# Patient Record
Sex: Male | Born: 1995 | Race: Black or African American | Hispanic: No | Marital: Single | Smoking: Current every day smoker
Health system: Southern US, Community
[De-identification: ages and names within clinical notes are randomized; demographics above are authoritative.]

---
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
## 2015-01-08 ENCOUNTER — Emergency Department (HOSPITAL_COMMUNITY)
Admission: EM | Admit: 2015-01-08 | Discharge: 2015-01-08 | Disposition: A | Discharge status: Home / Self Care | Payer: BLUE CROSS/BLUE SHIELD | Attending: Emergency Medicine | Admitting: Emergency Medicine

## 2015-01-08 ENCOUNTER — Emergency Department (HOSPITAL_COMMUNITY): Payer: BLUE CROSS/BLUE SHIELD

## 2015-01-08 ENCOUNTER — Encounter (HOSPITAL_COMMUNITY): Payer: Self-pay | Admitting: Emergency Medicine

## 2015-01-08 DIAGNOSIS — Z720 Tobacco use: Secondary | ICD-10-CM | POA: Insufficient documentation

## 2015-01-08 DIAGNOSIS — M549 Dorsalgia, unspecified: Secondary | ICD-10-CM | POA: Insufficient documentation

## 2015-01-08 DIAGNOSIS — R51 Headache: Secondary | ICD-10-CM | POA: Insufficient documentation

## 2015-01-08 DIAGNOSIS — J4521 Mild intermittent asthma with (acute) exacerbation: Secondary | ICD-10-CM | POA: Insufficient documentation

## 2015-01-08 DIAGNOSIS — R0789 Other chest pain: Secondary | ICD-10-CM | POA: Insufficient documentation

## 2015-01-08 DIAGNOSIS — J4520 Mild intermittent asthma, uncomplicated: Secondary | ICD-10-CM

## 2015-01-08 DIAGNOSIS — J40 Bronchitis, not specified as acute or chronic: Secondary | ICD-10-CM

## 2015-01-08 MED ORDER — PREDNISONE 20 MG PO TABS: 40.0000 mg | ORAL_TABLET | Freq: Every day | ORAL | Status: DC

## 2015-01-08 MED ORDER — GUAIFENESIN-CODEINE 100-10 MG/5ML PO SOLN: 5.0000 mL | Freq: Four times a day (QID) | ORAL | Status: DC | PRN

## 2015-01-08 MED ORDER — GUAIFENESIN-CODEINE 100-10 MG/5ML PO SOLN
5.0000 mL | Freq: Once | ORAL | Status: AC
  Administered 2015-01-08: 5 mL via ORAL
  Filled 2015-01-08: qty 5

## 2015-01-08 MED ORDER — PREDNISONE 20 MG PO TABS
60.0000 mg | ORAL_TABLET | Freq: Once | ORAL | Status: AC
  Administered 2015-01-08: 60 mg via ORAL
  Filled 2015-01-08: qty 3

## 2015-01-08 MED ORDER — AMOXICILLIN 500 MG PO CAPS: 500.0000 mg | ORAL_CAPSULE | Freq: Three times a day (TID) | ORAL | Status: DC

## 2015-01-08 MED ORDER — AEROCHAMBER PLUS FLO-VU LARGE MISC
1.0000 | Freq: Once | Status: AC
  Administered 2015-01-08: 1
  Filled 2015-01-08: qty 1

## 2015-01-08 MED ORDER — ALBUTEROL SULFATE HFA 108 (90 BASE) MCG/ACT IN AERS
1.0000 | INHALATION_SPRAY | Freq: Once | RESPIRATORY_TRACT | Status: AC
  Administered 2015-01-08: 2 via RESPIRATORY_TRACT
  Filled 2015-01-08: qty 6.7

## 2015-01-08 NOTE — Discharge Instructions (Signed)
Bronchospasm °A bronchospasm is a spasm or tightening of the airways going into the lungs. During a bronchospasm breathing becomes more difficult because the airways get smaller. When this happens there can be coughing, a whistling sound when breathing (wheezing), and difficulty breathing. Bronchospasm is often associated with asthma, but not all patients who experience a bronchospasm have asthma. °CAUSES  °A bronchospasm is caused by inflammation or irritation of the airways. The inflammation or irritation may be triggered by:  °· Allergies (such as to animals, pollen, food, or mold). Allergens that cause bronchospasm may cause wheezing immediately after exposure or many hours later.   °· Infection. Viral infections are believed to be the most common cause of bronchospasm.   °· Exercise.   °· Irritants (such as pollution, cigarette smoke, strong odors, aerosol sprays, and paint fumes).   °· Weather changes. Winds increase molds and pollens in the air. Rain refreshes the air by washing irritants out. Cold air may cause inflammation.   °· Stress and emotional upset.   °SIGNS AND SYMPTOMS  °· Wheezing.   °· Excessive nighttime coughing.   °· Frequent or severe coughing with a simple cold.   °· Chest tightness.   °· Shortness of breath.   °DIAGNOSIS  °Bronchospasm is usually diagnosed through a history and physical exam. Tests, such as chest X-rays, are sometimes done to look for other conditions. °TREATMENT  °· Inhaled medicines can be given to open up your airways and help you breathe. The medicines can be given using either an inhaler or a nebulizer machine. °· Corticosteroid medicines may be given for severe bronchospasm, usually when it is associated with asthma. °HOME CARE INSTRUCTIONS  °· Always have a plan prepared for seeking medical care. Know when to call your health care provider and local emergency services (911 in the [place] Know where you can access local emergency care. °· Only take medicines as  directed by your health care provider. °· If you were prescribed an inhaler or nebulizer machine, ask your health care provider to explain how to use it correctly. Always use a spacer with your inhaler if you were given one. °· It is necessary to remain calm during an attack. Try to relax and breathe more slowly.  °· Control your home environment in the following ways:   °· Change your heating and air conditioning filter at least once a month.   °· Limit your use of fireplaces and wood stoves. °· Do not smoke and do not allow smoking in your home.   °· Avoid exposure to perfumes and fragrances.   °· Get rid of pests (such as roaches and mice) and their droppings.   °· Throw away plants if you see mold on them.   °· Keep your house clean and dust free.   °· Replace carpet with wood, tile, or vinyl flooring. Carpet can trap dander and dust.   °· Use allergy-proof pillows, mattress covers, and box spring covers.   °· Wash bed sheets and blankets every week in hot water and dry them in a dryer.   °· Use blankets that are made of polyester or cotton.   °· Wash hands frequently. °SEEK MEDICAL CARE IF:  °· You have muscle aches.   °· You have chest pain.   °· The sputum changes from clear or white to yellow, green, gray, or bloody.   °· The sputum you cough up gets thicker.   °· There are problems that may be related to the medicine you are given, such as a rash, itching, swelling, or trouble breathing.   °SEEK IMMEDIATE MEDICAL CARE IF:  °· You have worsening wheezing and coughing even   after taking your prescribed medicines.   °· You have increased difficulty breathing.   °· You develop severe chest pain. °MAKE SURE YOU:  °· Understand these instructions. °· Will watch your condition. °· Will get help right away if you are not doing well or get worse. °Document Released: 08/19/2003 Document Revised: 08/21/2013 Document Reviewed: 02/05/2013 °ExitCare® Patient Information ©2015 [employer]. This information is not  intended to replace advice given to you by your health care provider. Make sure you discuss any questions you have with your health care provider. ° °Upper Respiratory Infection, Adult °An upper respiratory infection (URI) is also sometimes known as the common cold. The upper respiratory tract includes the nose, sinuses, throat, trachea, and bronchi. Bronchi are the airways leading to the lungs. Most people improve within 1 week, but symptoms can last up to 2 weeks. A residual cough may last even longer.  °CAUSES °Many different viruses can infect the tissues lining the upper respiratory tract. The tissues become irritated and inflamed and often become very moist. Mucus production is also common. A cold is contagious. You can easily spread the virus to others by oral contact. This includes kissing, sharing a glass, coughing, or sneezing. Touching your mouth or nose and then touching a surface, which is then touched by another person, can also spread the virus. °SYMPTOMS  °Symptoms typically develop 1 to 3 days after you come in contact with a cold virus. Symptoms vary from person to person. They may include: °· Runny nose. °· Sneezing. °· Nasal congestion. °· Sinus irritation. °· Sore throat. °· Loss of voice (laryngitis). °· Cough. °· Fatigue. °· Muscle aches. °· Loss of appetite. °· Headache. °· Low-grade fever. °DIAGNOSIS  °You might diagnose your own cold based on familiar symptoms, since most people get a cold 2 to 3 times a year. Your caregiver can confirm this based on your exam. Most importantly, your caregiver can check that your symptoms are not due to another disease such as strep throat, sinusitis, pneumonia, asthma, or epiglottitis. Blood tests, throat tests, and X-rays are not necessary to diagnose a common cold, but they may sometimes be helpful in excluding other more serious diseases. Your caregiver will decide if any further tests are required. °RISKS AND COMPLICATIONS  °You may be at risk for a  more severe case of the common cold if you smoke cigarettes, have chronic heart disease (such as heart failure) or lung disease (such as asthma), or if you have a weakened immune system. The very young and very old are also at risk for more serious infections. Bacterial sinusitis, middle ear infections, and bacterial pneumonia can complicate the common cold. The common cold can worsen asthma and chronic obstructive pulmonary disease (COPD). Sometimes, these complications can require emergency medical care and may be life-threatening. °PREVENTION  °The best way to protect against getting a cold is to practice good hygiene. Avoid oral or hand contact with people with cold symptoms. Wash your hands often if contact occurs. There is no clear evidence that vitamin C, vitamin E, echinacea, or exercise reduces the chance of developing a cold. However, it is always recommended to get plenty of rest and practice good nutrition. °TREATMENT  °Treatment is directed at relieving symptoms. There is no cure. Antibiotics are not effective, because the infection is caused by a virus, not by bacteria. Treatment may include: °· Increased fluid intake. Sports drinks offer valuable electrolytes, sugars, and fluids. °· Breathing heated mist or steam (vaporizer or shower). °· Eating chicken soup   or other clear broths, and maintaining good nutrition. °· Getting plenty of rest. °· Using gargles or lozenges for comfort. °· Controlling fevers with ibuprofen or acetaminophen as directed by your caregiver. °· Increasing usage of your inhaler if you have asthma. °Zinc gel and zinc lozenges, taken in the first 24 hours of the common cold, can shorten the duration and lessen the severity of symptoms. Pain medicines may help with fever, muscle aches, and throat pain. A variety of non-prescription medicines are available to treat congestion and runny nose. Your caregiver can make recommendations and may suggest nasal or lung inhalers for other  symptoms.  °HOME CARE INSTRUCTIONS  °· Only take over-the-counter or prescription medicines for pain, discomfort, or fever as directed by your caregiver. °· Use a warm mist humidifier or inhale steam from a shower to increase air moisture. This may keep secretions moist and make it easier to breathe. °· Drink enough water and fluids to keep your urine clear or pale yellow. °· Rest as needed. °· Return to work when your temperature has returned to normal or as your caregiver advises. You may need to stay home longer to avoid infecting others. You can also use a face mask and careful hand washing to prevent spread of the virus. °SEEK MEDICAL CARE IF:  °· After the first few days, you feel you are getting worse rather than better. °· You need your caregiver's advice about medicines to control symptoms. °· You develop chills, worsening shortness of breath, or brown or red sputum. These may be signs of pneumonia. °· You develop yellow or brown nasal discharge or pain in the face, especially when you bend forward. These may be signs of sinusitis. °· You develop a fever, swollen neck glands, pain with swallowing, or white areas in the back of your throat. These may be signs of strep throat. °SEEK IMMEDIATE MEDICAL CARE IF:  °· You have a fever. °· You develop severe or persistent headache, ear pain, sinus pain, or chest pain. °· You develop wheezing, a prolonged cough, cough up blood, or have a change in your usual mucus (if you have chronic lung disease). °· You develop sore muscles or a stiff neck. °Document Released: 02/09/2001 Document Revised: 11/08/2011 Document Reviewed: 11/21/2013 °ExitCare® Patient Information ©2015 [employer]. This information is not intended to replace advice given to you by your health care provider. Make sure you discuss any questions you have with your health care provider. ° °

## 2015-01-08 NOTE — ED Notes (Addendum)
Pt is requesting that his ears be assessed.  This RN spoke to pt about his symptoms, denies any pain, but sts "they feel congested"  Pt also requesting medication to help with his nasal congestion.

## 2015-01-08 NOTE — ED Provider Notes (Signed)
CSN: [id number]     Arrival date & time 01/08/15  1955 History  This chart was scribed for a non-physician practitioner, Arthor CaptainAbigail Tzion Wedel, PA-C working with Raeford RazorStephen Kohut, MD by [place] Peace, ED Scribe. The patient was seen in TR04C/TR04C. The patient's care was started at 10:01 PM.    Chief Complaint  Patient presents with  . Bronchitis    The patient said he was diagnosed with Bronch[facility] at [facility] and was given clear pills for his cough and bronchitis medicine.  The patient said he has gotten worse instead of better.  . Cough      Patient is a 19 y.o. male presenting with cough. The history is provided by the patient. No language interpreter was used.  Cough Associated symptoms: ear pain, headaches and shortness of breath   Associated symptoms: no fever   HPI Comments: Andrew Moyer is a 19 y.o. male who presents to the Emergency Department complaining of bronchitis-like symptoms including productive cough (sputum: yellowish), SOB, chest tightness, headache. Pt also complains of low back pain which he thinks is from coughing so much. No complaints of fever, nausea, or vomiting. He was seen at [facility] a few weeks ago where he was diagnosed with Bronchitis and given some antibiotics without relief. Pt notes he didn't get much sleep last night.  No history of asthma. Pt is current some day smoker but explains he hasn't smoked since symptoms started.    History reviewed. No pertinent past medical history. History reviewed. No pertinent past surgical history. Family History  Problem Relation Age of Onset  . Diabetes     History  Substance Use Topics  . Smoking status: Current Some Day Smoker  . Smokeless tobacco: Not on file  . Alcohol Use: Yes     Comment: occasional    Review of Systems  Constitutional: Negative for fever.  HENT: Positive for ear pain.   Respiratory: Positive for cough, chest tightness and shortness of breath.   Gastrointestinal: Negative for nausea  and vomiting.  Musculoskeletal: Positive for back pain.  Neurological: Positive for headaches.  All other systems reviewed and are negative.     Allergies  Review of patient's allergies indicates no known allergies.  Home Medications   Prior to Admission medications   Medication Sig Start Date End Date Taking? Authorizing Provider  azithromycin (ZITHROMAX) 250 MG tablet Take 1 tablet (250 mg total) by mouth daily. Take first 2 tablets together, then 1 every day until finished. 12/25/14   Hope Orlene OchM Neese, NP  benzonatate (TESSALON) 100 MG capsule Take 1 capsule (100 mg total) by mouth every 8 (eight) hours. 12/25/14   Hope Orlene OchM Neese, NP  HYDROcodone-acetaminophen (NORCO/VICODIN) 5-325 MG per tablet Take 1 tablet by mouth every 6 (six) hours as needed for pain. 05/30/12   Vickki HearingStanley E Harrison, MD  ibuprofen (ADVIL,MOTRIN) 800 MG tablet Take 1 tablet (800 mg total) by mouth every 8 (eight) hours as needed for pain. 05/30/12   Vickki HearingStanley E Harrison, MD   BP 112/57 mmHg  Pulse 84  Temp(Src) 98.4 F (36.9 C) (Oral)  Resp 24  Wt 181 lb (82.101 kg)  SpO2 97% Physical Exam  Constitutional: He is oriented to person, place, and time. He appears well-developed and well-nourished. No distress.  HENT:  Head: Normocephalic and atraumatic.  Right Ear: Tympanic membrane is injected.  Left Ear: Tympanic membrane normal.  Mouth/Throat: Oropharynx is clear and moist.  Eyes: Conjunctivae and EOM are normal. No scleral icterus.  Neck:  Normal range of motion. Neck supple. No tracheal deviation present.  Cardiovascular: Normal rate, regular rhythm and normal heart sounds.   Pulmonary/Chest: Effort normal and breath sounds normal. No respiratory distress.  Tight bronchitic cough. Lungs otherwise clear.   Abdominal: Soft. There is no tenderness.  Musculoskeletal: Normal range of motion. He exhibits no edema.  Neurological: He is alert and oriented to person, place, and time.  Skin: Skin is warm and dry. He is not  diaphoretic.  Psychiatric: He has a normal mood and affect. His behavior is normal.  Nursing note and vitals reviewed.   ED Course  Procedures (including critical care time) Labs Review Labs Reviewed - No data to display  Imaging Review Dg Chest 2 View  01/08/2015   CLINICAL DATA:  Cough and fever.  Bronchitis.  EXAM: CHEST  2 VIEW  COMPARISON:  December 24, 2014.  FINDINGS: The heart size and mediastinal contours are within normal limits. Both lungs are clear. No pneumothorax or pleural effusion is noted. The visualized skeletal structures are unremarkable.  IMPRESSION: No active cardiopulmonary disease.   Electronically Signed   By: Lupita RaiderJames  Green Jr, M.D.   On: 01/08/2015 21:23     EKG Interpretation None     Medications  guaiFENesin-codeine 100-10 MG/5ML solution 5 mL (not administered)   10:06 PM- Treatment plan was discussed with patient who verbalizes understanding and agrees.   MDM   Final diagnoses:  RAD (reactive airway disease) with wheezing, mild intermittent, uncomplicated  Bronchitis    Patient with bronchitis, reactive airway. She'll be discharged with prednisone, codeine and guaifenesin cough syrup. Right ear with acute otitis. Patient will be given amoxicillin. He appears safe for discharge. Discussed return precautions, follow up with PCP.  I personally performed the services described in this documentation, which was scribed in my presence. The recorded information has been reviewed and is accurate.      Arthor Captainbigail Cheria Sadiq, PA-C 01/10/15 1642  Raeford RazorStephen Kohut, MD 01/10/15 [id number]

## 2015-01-08 NOTE — ED Notes (Signed)
The patient said he was diagnosed with Bronchitis at [facility] and was given clear pills for his cough and bronchitis medicine.  The patient said he has gotten worse instead of better.  The patient denies fever, N/V.  The patient denies any other symptoms other than yellow sputum.

## 2015-05-20 IMAGING — DX DG CHEST 2V
2 series · 2 of 2 positions shown · non-contrast
Comparison: CT of the chest performed 06/06/2012

CLINICAL DATA: Acute onset of cough for 1 [DATE] weeks. Fever. Initial
encounter.

EXAM:
CHEST  2 VIEW

[chest pa]
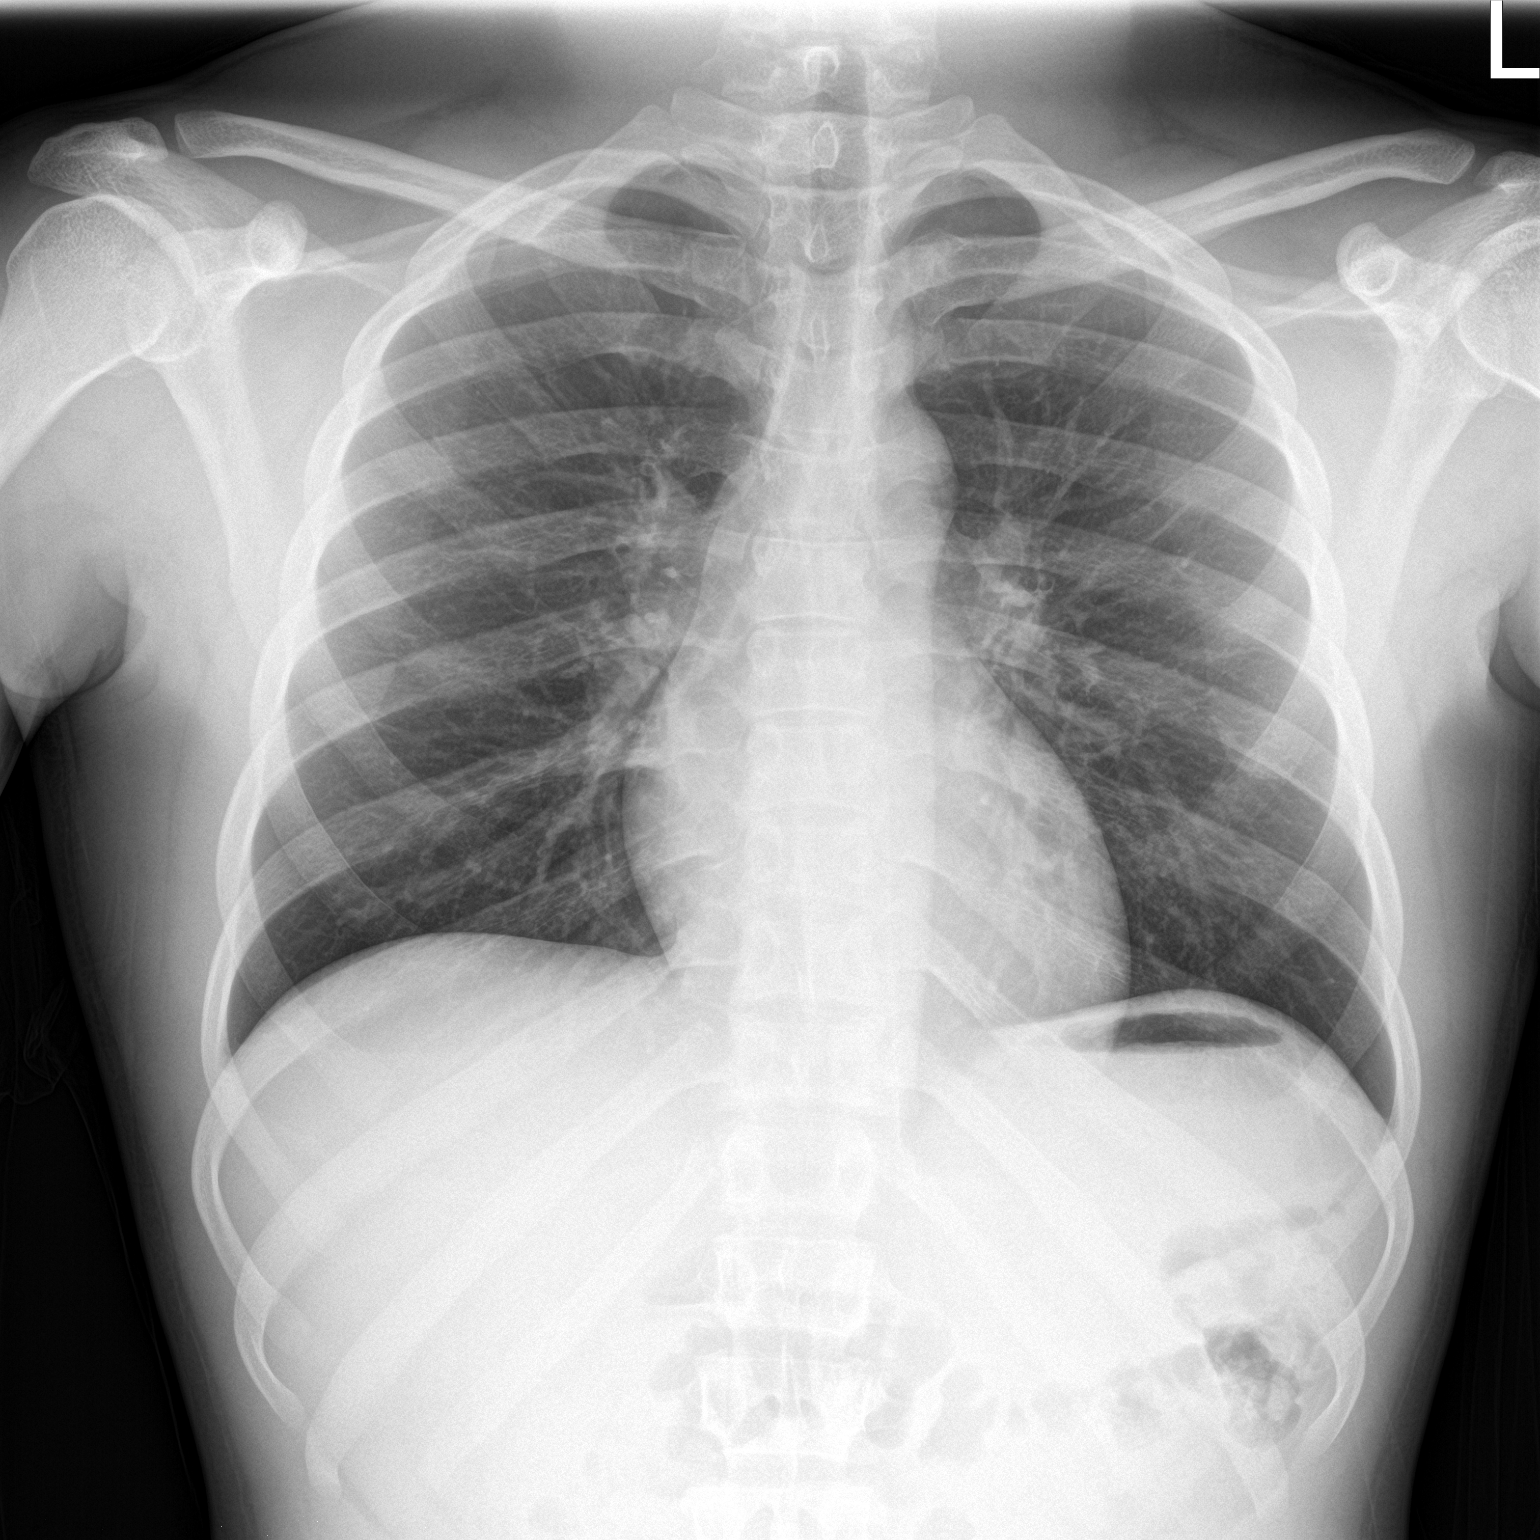

[chest lat]
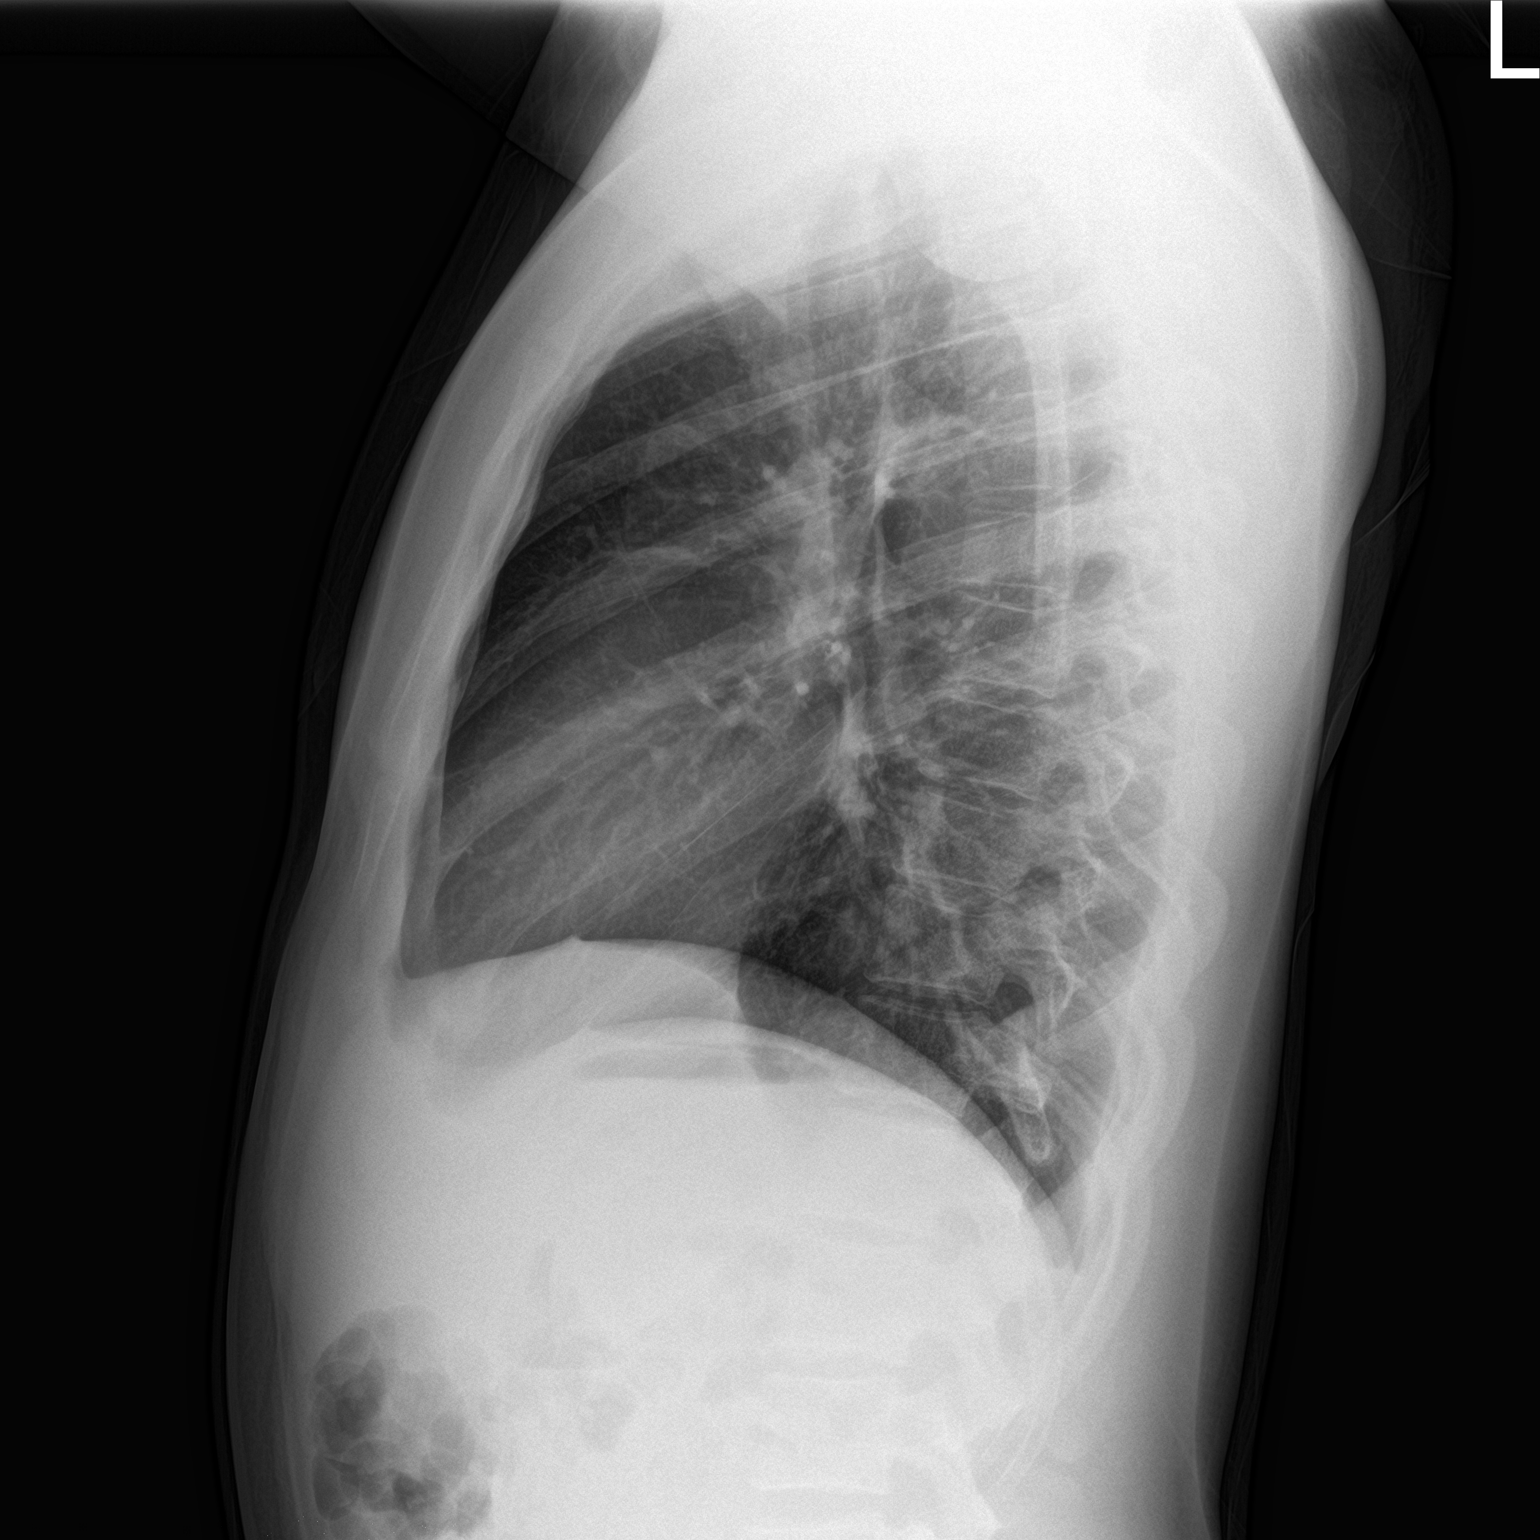

[2 of 2 positions shown; findings below may reference images not displayed]

FINDINGS: The lungs are well-aerated and clear. There is no evidence of focal
opacification, pleural effusion or pneumothorax.

The heart is normal in size; the mediastinal contour is within
normal limits. No acute osseous abnormalities are seen.
IMPRESSION: No acute cardiopulmonary process seen.

## 2015-06-04 IMAGING — CR DG CHEST 2V
2 series · 2 of 2 positions shown · non-contrast
Comparison: December 24, 2014.

CLINICAL DATA: Cough and fever.  Bronchitis.

EXAM:
CHEST  2 VIEW

[chest pa]
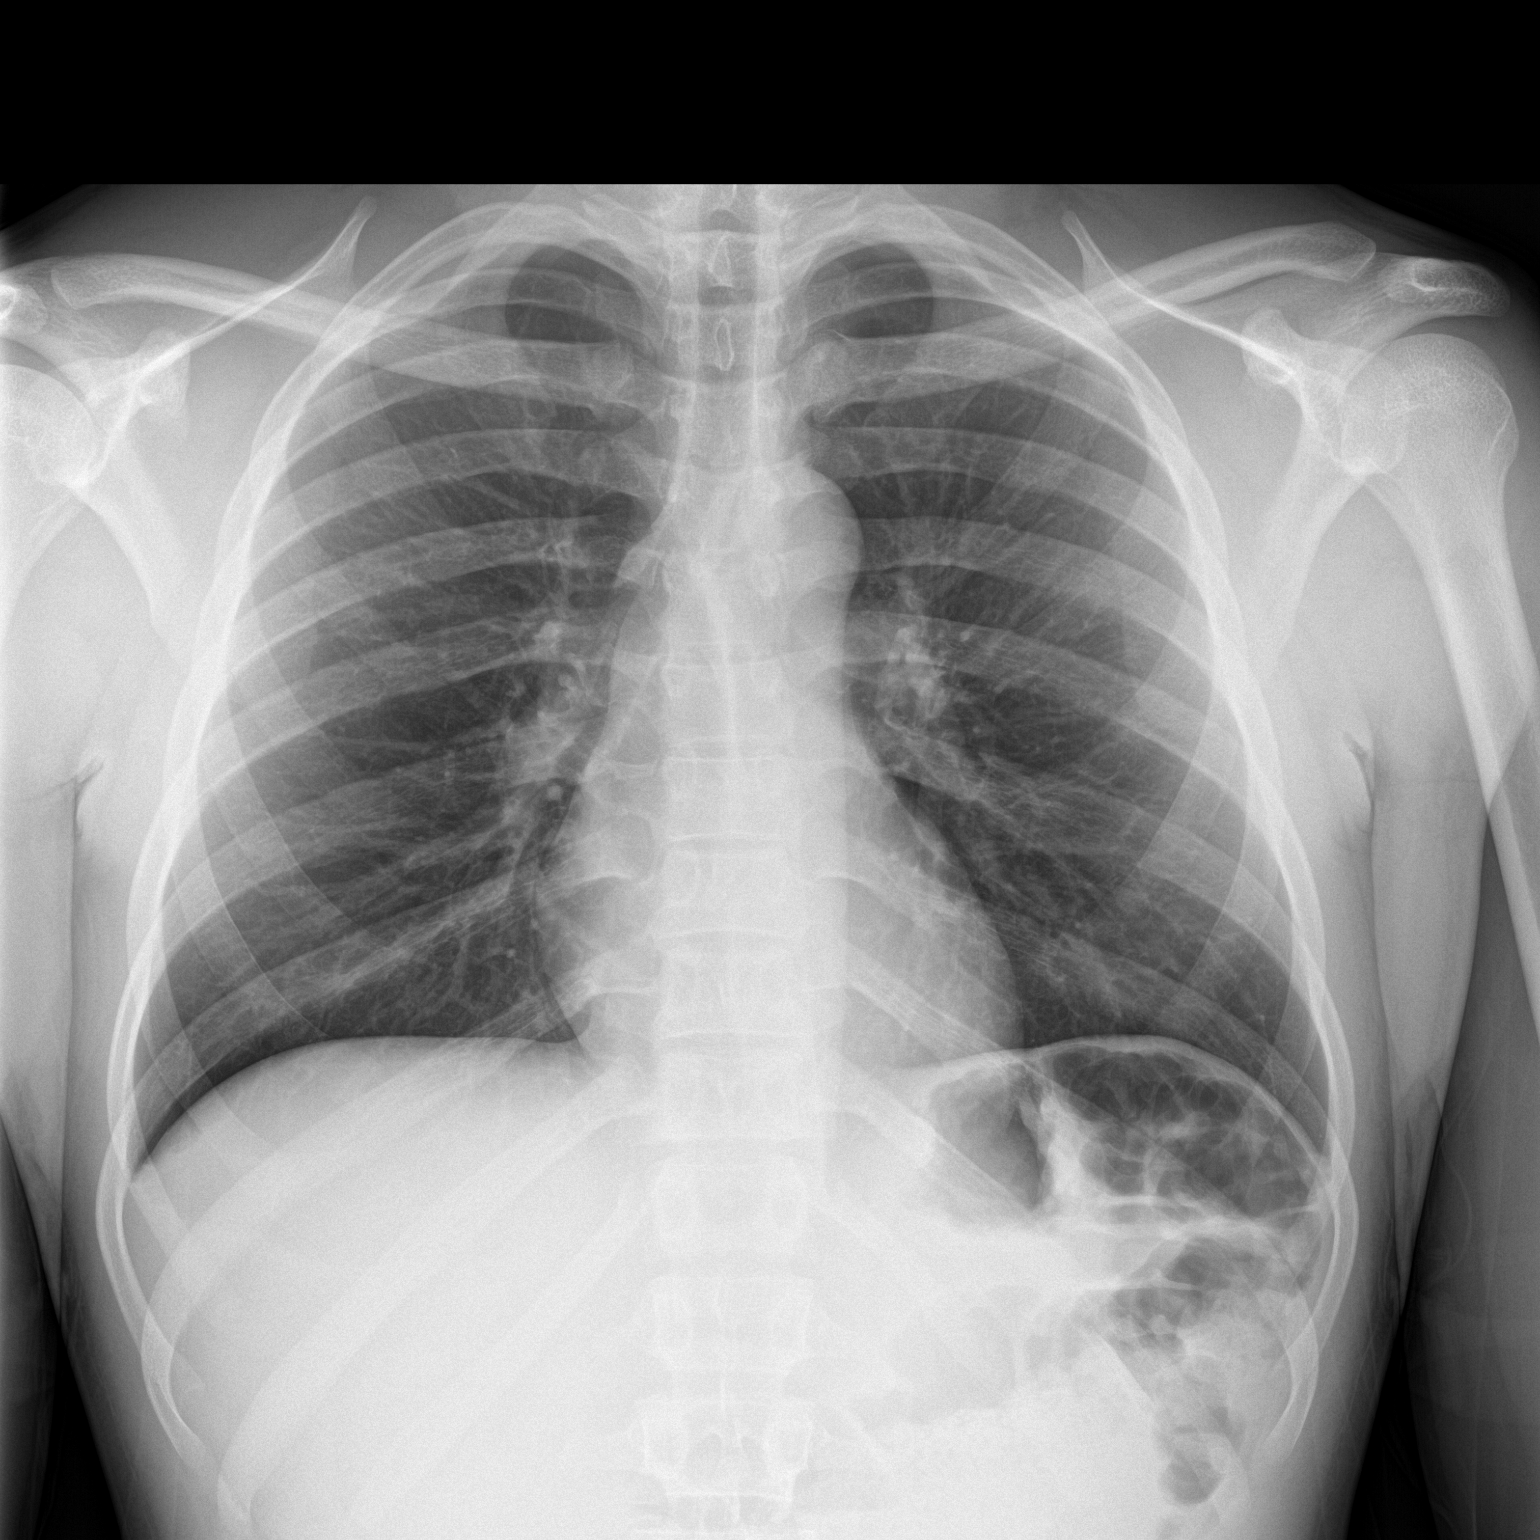

[chest lat]
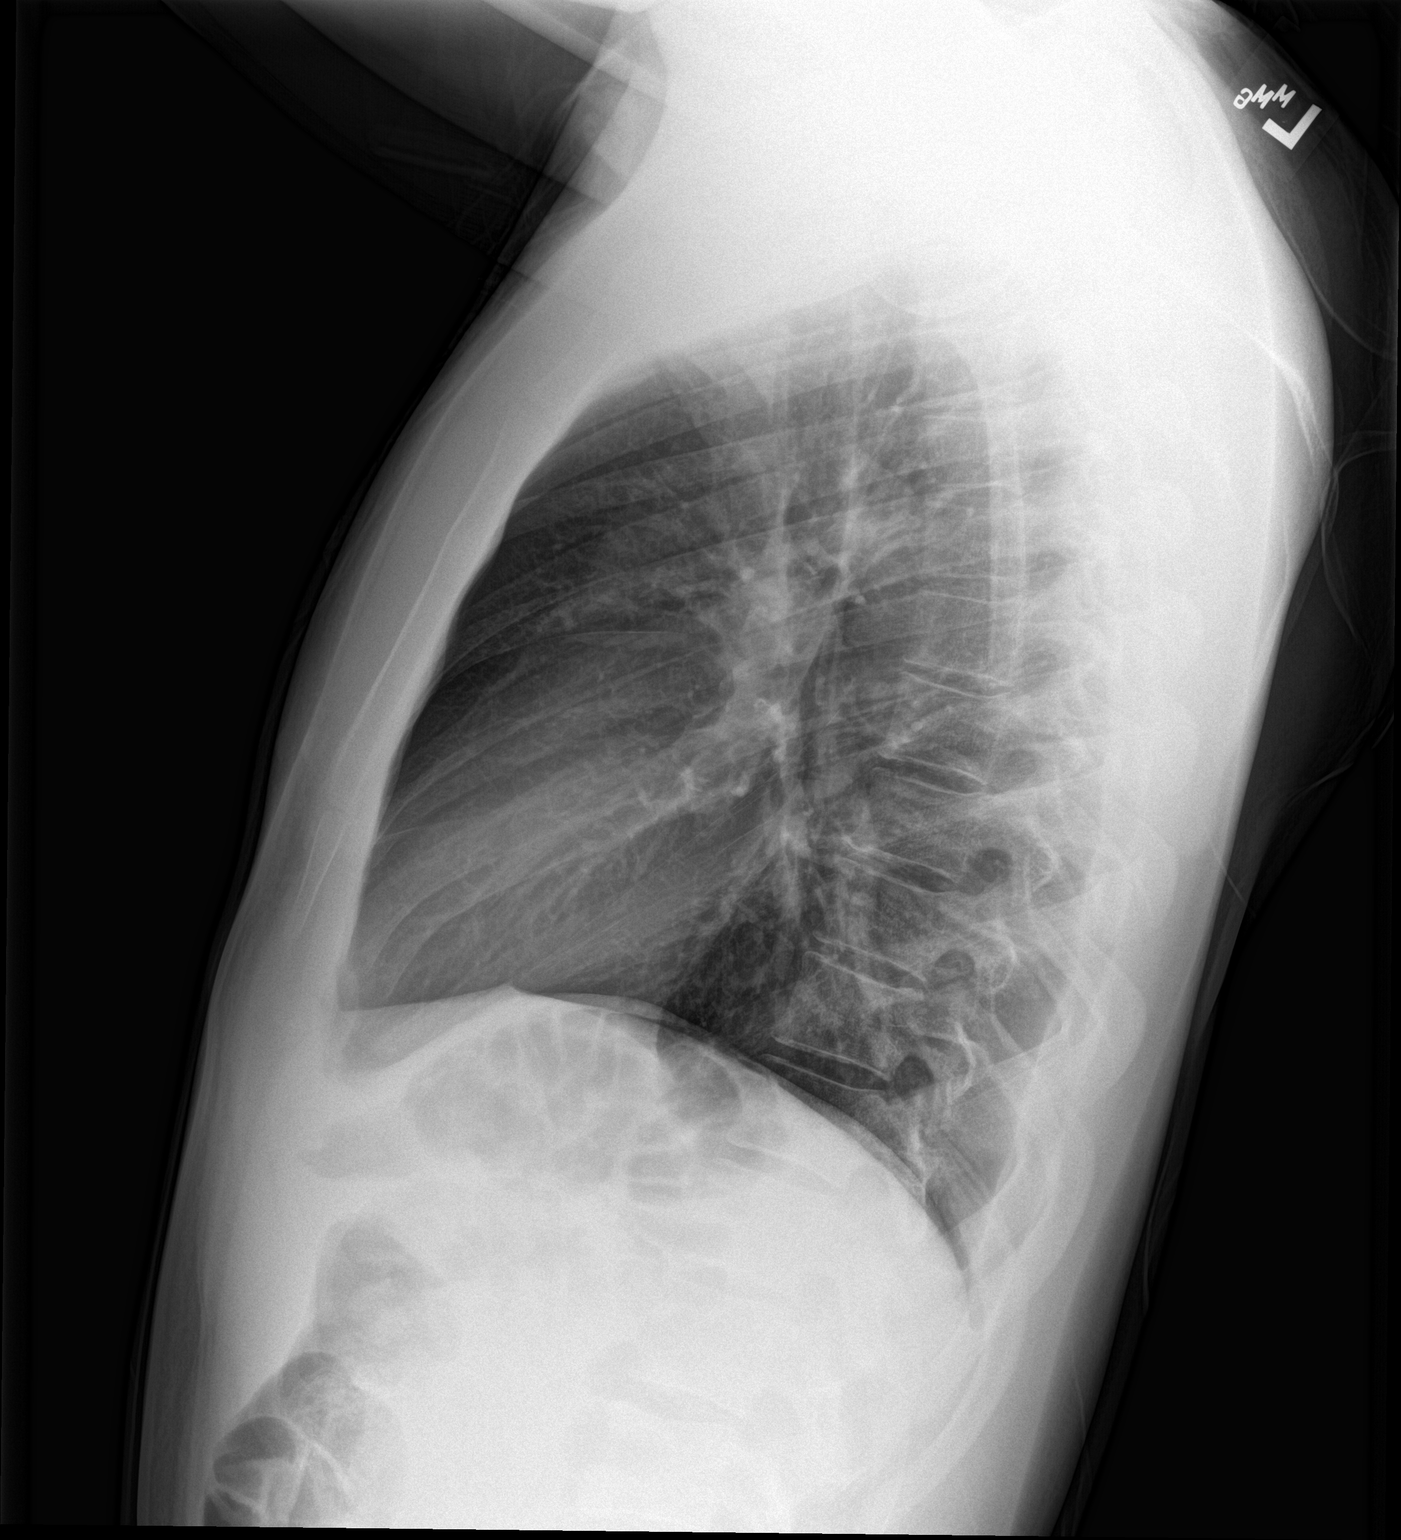

[2 of 2 positions shown; findings below may reference images not displayed]

FINDINGS: The heart size and mediastinal contours are within normal limits.
Both lungs are clear. No pneumothorax or pleural effusion is noted.
The visualized skeletal structures are unremarkable.
IMPRESSION: No active cardiopulmonary disease.

## 2015-08-27 ENCOUNTER — Emergency Department (HOSPITAL_COMMUNITY)
Admission: EM | Admit: 2015-08-27 | Discharge: 2015-08-27 | Disposition: A | Discharge status: Home / Self Care | Payer: Self-pay | Attending: Emergency Medicine | Admitting: Emergency Medicine

## 2015-08-27 ENCOUNTER — Encounter (HOSPITAL_COMMUNITY): Payer: Self-pay | Admitting: Emergency Medicine

## 2015-08-27 DIAGNOSIS — F17200 Nicotine dependence, unspecified, uncomplicated: Secondary | ICD-10-CM | POA: Insufficient documentation

## 2015-08-27 DIAGNOSIS — J029 Acute pharyngitis, unspecified: Secondary | ICD-10-CM | POA: Insufficient documentation

## 2015-08-27 DIAGNOSIS — R0981 Nasal congestion: Secondary | ICD-10-CM | POA: Insufficient documentation

## 2015-08-27 LAB — RAPID STREP SCREEN (MED CTR MEBANE ONLY): Streptococcus, Group A Screen (Direct): NEGATIVE

## 2015-08-27 MED ORDER — MAGIC MOUTHWASH W/LIDOCAINE: 5.0000 mL | Freq: Three times a day (TID) | ORAL | Status: DC | PRN

## 2015-08-27 MED ORDER — IBUPROFEN 800 MG PO TABS: 800.0000 mg | ORAL_TABLET | Freq: Three times a day (TID) | ORAL | Status: DC

## 2015-08-27 NOTE — Discharge Instructions (Signed)

## 2015-08-27 NOTE — ED Notes (Signed)
Patient complaining of sore throat starting last night.  

## 2015-08-30 LAB — CULTURE, GROUP A STREP

## 2015-08-30 NOTE — ED Provider Notes (Signed)
CSN: [id number]     Arrival date & time 08/27/15  1321 History   First MD Initiated Contact with Patient 08/27/15 1343     Chief Complaint  Patient presents with  . Sore Throat     (Consider location/radiation/quality/duration/timing/severity/associated sxs/prior Treatment) HPI  Andrew Moyer is a 19 y.o. male who presents to the Emergency Department complaining of sore throat for one day.  He states he woke up with the sore throat.  He has not tried any medications for the symptoms.  He states pain is worse with swallowing.  He denies fever, chills, rash, abd pain, cough, headache.   History reviewed. No pertinent past medical history. History reviewed. No pertinent past surgical history. Family History  Problem Relation Age of Onset  . Diabetes     Social History  Substance Use Topics  . Smoking status: Current Some Day Smoker  . Smokeless tobacco: None  . Alcohol Use: Yes     Comment: occasional    Review of Systems  Constitutional: Negative for fever, chills, activity change and appetite change.  HENT: Positive for congestion and sore throat. Negative for ear pain, facial swelling, trouble swallowing and voice change.   Eyes: Negative for pain and visual disturbance.  Respiratory: Negative for cough and shortness of breath.   Gastrointestinal: Negative for nausea, vomiting and abdominal pain.  Musculoskeletal: Negative for arthralgias, neck pain and neck stiffness.  Skin: Negative for color change and rash.  Neurological: Negative for dizziness, facial asymmetry, speech difficulty, numbness and headaches.  Hematological: Negative for adenopathy.  All other systems reviewed and are negative.     Allergies  Review of patient's allergies indicates no known allergies.  Home Medications   Prior to Admission medications   Medication Sig Start Date End Date Taking? Authorizing Provider  amoxicillin (AMOXIL) 500 MG capsule Take 1 capsule (500 mg total) by mouth 3  (three) times daily. Patient not taking: Reported on 08/27/2015 01/08/15   Arthor Captain, PA-C  azithromycin (ZITHROMAX) 250 MG tablet Take 1 tablet (250 mg total) by mouth daily. Take first 2 tablets together, then 1 every day until finished. Patient not taking: Reported on 08/27/2015 12/25/14   Janne Napoleon, NP  benzonatate (TESSALON) 100 MG capsule Take 1 capsule (100 mg total) by mouth every 8 (eight) hours. Patient not taking: Reported on 08/27/2015 12/25/14   Janne Napoleon, NP  guaiFENesin-codeine 100-10 MG/5ML syrup Take 5-10 mLs by mouth every 6 (six) hours as needed for cough. Patient not taking: Reported on 08/27/2015 01/08/15   Arthor Captain, PA-C  HYDROcodone-acetaminophen (NORCO/VICODIN) 5-325 MG per tablet Take 1 tablet by mouth every 6 (six) hours as needed for pain. Patient not taking: Reported on 08/27/2015 05/30/12   Vickki Hearing, MD  ibuprofen (ADVIL,MOTRIN) 800 MG tablet Take 1 tablet (800 mg total) by mouth 3 (three) times daily. 08/27/15   Khaniya Tenaglia, PA-C  magic mouthwash w/lidocaine SOLN Take 5 mLs by mouth 3 (three) times daily as needed for mouth pain. Swish and spit, do not swallow 08/27/15   Jaylissa Felty, PA-C  predniSONE (DELTASONE) 20 MG tablet Take 2 tablets (40 mg total) by mouth daily. Patient not taking: Reported on 08/27/2015 01/08/15   Arthor Captain, PA-C   BP 106/57 mmHg  Pulse 91  Temp(Src) 99.6 F (37.6 C) (Oral)  Resp 16  Ht  (1.778 m)  Wt 81.647 kg  BMI 25.83 kg/m2  SpO2 100% Physical Exam  Constitutional: He is oriented to person, place, and  time. He appears well-developed and well-nourished. No distress.  HENT:  Head: Normocephalic and atraumatic.  Right Ear: Tympanic membrane and ear canal normal.  Left Ear: Tympanic membrane and ear canal normal.  Mouth/Throat: Uvula is midline and mucous membranes are normal. No trismus in the jaw. No uvula swelling. Posterior oropharyngeal edema and posterior oropharyngeal erythema present. No  tonsillar abscesses.  Neck: Normal range of motion. Neck supple.  Cardiovascular: Normal rate, regular rhythm and normal heart sounds.   Pulmonary/Chest: Effort normal and breath sounds normal. No respiratory distress.  Abdominal: There is no splenomegaly. There is no tenderness.  Musculoskeletal: Normal range of motion.  Lymphadenopathy:    He has no cervical adenopathy.  Neurological: He is alert and oriented to person, place, and time. He exhibits normal muscle tone. Coordination normal.  Skin: Skin is warm and dry.  Nursing note and vitals reviewed.   ED Course  Procedures (including critical care time) Labs Review Labs Reviewed  CULTURE, GROUP A STREP - Abnormal; Notable for the following:    Strep A Culture Comment (*)    All other components within normal limits  RAPID STREP SCREEN (NOT AT [facility])    Imaging Review No results found. I have personally reviewed and evaluated these images and lab results as part of my medical decision-making.   EKG Interpretation None      MDM   Final diagnoses:  Pharyngitis    Pt is well appearing.  Airway patent.  Non-toxic.  Strep neg.  Pt agrees to symptomatic tx with magic mouthwash and ibuprofen PMD f/u if needed    Pauline Ausammy Thorin Starner, PA-C 08/30/15 2303  Glynn OctaveStephen Rancour, MD 08/31/15 [phone]

## 2017-03-22 ENCOUNTER — Emergency Department (HOSPITAL_COMMUNITY)
Admission: EM | Admit: 2017-03-22 | Discharge: 2017-03-22 | Disposition: A | Discharge status: Home / Self Care | Payer: Self-pay | Attending: Emergency Medicine | Admitting: Emergency Medicine

## 2017-03-22 ENCOUNTER — Encounter (HOSPITAL_COMMUNITY): Payer: Self-pay | Admitting: Emergency Medicine

## 2017-03-22 DIAGNOSIS — B86 Scabies: Secondary | ICD-10-CM | POA: Insufficient documentation

## 2017-03-22 DIAGNOSIS — F17210 Nicotine dependence, cigarettes, uncomplicated: Secondary | ICD-10-CM | POA: Insufficient documentation

## 2017-03-22 DIAGNOSIS — Z79899 Other long term (current) drug therapy: Secondary | ICD-10-CM | POA: Insufficient documentation

## 2017-03-22 MED ORDER — PERMETHRIN 5 % EX CREA: TOPICAL_CREAM | CUTANEOUS | 1 refills | Status: DC

## 2017-03-22 MED ORDER — DIPHENHYDRAMINE HCL 25 MG PO TABS: 25.0000 mg | ORAL_TABLET | ORAL | 0 refills | Status: DC | PRN

## 2017-03-22 NOTE — ED Triage Notes (Signed)
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 Rash to wrist. He is concerned that he has scabies.

## 2017-03-22 NOTE — Discharge Instructions (Signed)
Be sure to wash all towels, clothing and ben linens in hot water after treatment.

## 2017-03-22 NOTE — ED Provider Notes (Signed)
[facility]-EMERGENCY DEPT Provider Note   CSN: [id number] Arrival date & time: 03/22/17  [id number]     History   Chief Complaint Chief Complaint  Patient presents with  . Rash    HPI Andrew Moyer is a 21 y.o. male.  HPI  Andrew Moyer is a 21 y.o. male who presents to the Emergency Department complaining of severe itching and rash for one week.  He states that he has been staying with some friends and sleeping on an air mattress on the floor.  He states that the living conditions there were poor and the person also had a dog in the house.  He describes multiple, small "bumps" to the area between his fingers, wrists and groin area.  Itching is severe at night. He denies pain, swelling, drainage, and recent tick bites.   History reviewed. No pertinent past medical history.  Patient Active Problem List   Diagnosis Date Noted  . FRACTURE, WRIST, LEFT 01/31/2008    History reviewed. No pertinent surgical history.     Home Medications    Prior to Admission medications   Medication Sig Start Date End Date Taking? Authorizing Provider  amoxicillin (AMOXIL) 500 MG capsule Take 1 capsule (500 mg total) by mouth 3 (three) times daily. Patient not taking: Reported on 08/27/2015 01/08/15   Arthor CaptainHarris, Abigail, PA-C  azithromycin (ZITHROMAX) 250 MG tablet Take 1 tablet (250 mg total) by mouth daily. Take first 2 tablets together, then 1 every day until finished. Patient not taking: Reported on 08/27/2015 12/25/14   Janne NapoleonNeese, Hope M, NP  benzonatate (TESSALON) 100 MG capsule Take 1 capsule (100 mg total) by mouth every 8 (eight) hours. Patient not taking: Reported on 08/27/2015 12/25/14   Janne NapoleonNeese, Hope M, NP  diphenhydrAMINE (BENADRYL) 25 MG tablet Take 1 tablet (25 mg total) by mouth every 4 (four) hours as needed. For itching 03/22/17   Aaryn Parrilla, PA-C  guaiFENesin-codeine 100-10 MG/5ML syrup Take 5-10 mLs by mouth every 6 (six) hours as needed for cough. Patient not taking: Reported on 08/27/2015  01/08/15   Arthor CaptainHarris, Abigail, PA-C  HYDROcodone-acetaminophen (NORCO/VICODIN) 5-325 MG per tablet Take 1 tablet by mouth every 6 (six) hours as needed for pain. Patient not taking: Reported on 08/27/2015 05/30/12   Vickki HearingHarrison, Stanley E, MD  ibuprofen (ADVIL,MOTRIN) 800 MG tablet Take 1 tablet (800 mg total) by mouth 3 (three) times daily. 08/27/15   Maddex Garlitz, PA-C  magic mouthwash w/lidocaine SOLN Take 5 mLs by mouth 3 (three) times daily as needed for mouth pain. Swish and spit, do not swallow 08/27/15   Asti Mackley, PA-C  permethrin (ELIMITE) 5 % cream Apply from neck down.  Leave on for 8-10 hrs then wash off.  May repeat in 7 days if needed 03/22/17   Thersea Manfredonia, PA-C  predniSONE (DELTASONE) 20 MG tablet Take 2 tablets (40 mg total) by mouth daily. Patient not taking: Reported on 08/27/2015 01/08/15   Arthor CaptainHarris, Abigail, PA-C    Family History Family History  Problem Relation Age of Onset  . Diabetes Unknown     Social History Social History  Substance Use Topics  . Smoking status: Current Some Day Smoker    Packs/day: 0.50  . Smokeless tobacco: Never Used  . Alcohol use Yes     Comment: occasional     Allergies   Patient has no known allergies.   Review of Systems Review of Systems  Constitutional: Negative for activity change, appetite change, chills and fever.  HENT: Negative for facial  swelling, sore throat and trouble swallowing.   Respiratory: Negative for chest tightness, shortness of breath and wheezing.   Genitourinary: Negative for discharge, genital sores, penile pain and penile swelling.  Musculoskeletal: Negative for neck pain and neck stiffness.  Skin: Positive for rash. Negative for wound.  Neurological: Negative for dizziness, weakness, numbness and headaches.  All other systems reviewed and are negative.    Physical Exam Updated Vital Signs BP 123/70 (BP Location: Right Arm)   Pulse 70   Temp 98 F (36.7 C) (Oral)   Resp 20   Ht 6' (1.829  m)   Wt 90.7 kg (200 lb)   SpO2 96%   BMI 27.12 kg/m   Physical Exam  Constitutional: He is oriented to person, place, and time. He appears well-developed and well-nourished. No distress.  HENT:  Head: Normocephalic and atraumatic.  Mouth/Throat: Oropharynx is clear and moist.  Neck: Normal range of motion. Neck supple.  Cardiovascular: Normal rate, regular rhythm, normal heart sounds and intact distal pulses.   No murmur heard. Pulmonary/Chest: Effort normal and breath sounds normal. No respiratory distress.  Musculoskeletal: He exhibits no edema or tenderness.  Lymphadenopathy:    He has no cervical adenopathy.  Neurological: He is alert and oriented to person, place, and time. He exhibits normal muscle tone. Coordination normal.  Skin: Skin is warm. Rash noted. There is erythema.  Multiple, small erythematous papules to the web spaces of bilateral hands, bilateral groin, waistline and buttocks.  Few excoriations.  No edema or pustules.   Psychiatric: He has a normal mood and affect.  Nursing note and vitals reviewed.    ED Treatments / Results  Labs (all labs ordered are listed, but only abnormal results are displayed) Labs Reviewed - No data to display  EKG  EKG Interpretation None       Radiology No results found.  Procedures Procedures (including critical care time)  Medications Ordered in ED Medications - No data to display   Initial Impression / Assessment and Plan / ED Course  I have reviewed the triage vital signs and the nursing notes.  Pertinent labs & imaging results that were available during my care of the patient were reviewed by me and considered in my medical decision making (see chart for details).    vitasl stable.  Pt well appearing.  Rash appears c/w scabies.  Also notes that girlfriend has similar sx's.  rx for permethrin cream and benadryl.    Final Clinical Impressions(s) / ED Diagnoses   Final diagnoses:  Scabies    New  Prescriptions New Prescriptions   DIPHENHYDRAMINE (BENADRYL) 25 MG TABLET    Take 1 tablet (25 mg total) by mouth every 4 (four) hours as needed. For itching   PERMETHRIN (ELIMITE) 5 % CREAM    Apply from neck down.  Leave on for 8-10 hrs then wash off.  May repeat in 7 days if needed     Pauline Aus, Cordelia Poche 03/22/17 0981    Mancel Bale, MD 03/22/17 1520

## 2017-07-12 ENCOUNTER — Encounter (HOSPITAL_COMMUNITY): Payer: Self-pay | Admitting: *Deleted

## 2017-07-12 ENCOUNTER — Emergency Department (HOSPITAL_COMMUNITY)
Admission: EM | Admit: 2017-07-12 | Discharge: 2017-07-12 | Disposition: A | Discharge status: Home / Self Care | Payer: Self-pay | Attending: Emergency Medicine | Admitting: Emergency Medicine

## 2017-07-12 DIAGNOSIS — F17200 Nicotine dependence, unspecified, uncomplicated: Secondary | ICD-10-CM | POA: Insufficient documentation

## 2017-07-12 DIAGNOSIS — R21 Rash and other nonspecific skin eruption: Secondary | ICD-10-CM | POA: Insufficient documentation

## 2017-07-12 DIAGNOSIS — Z79899 Other long term (current) drug therapy: Secondary | ICD-10-CM | POA: Insufficient documentation

## 2017-07-12 LAB — URINALYSIS, ROUTINE W REFLEX MICROSCOPIC
Bilirubin Urine: NEGATIVE
GLUCOSE, UA: NEGATIVE mg/dL
Hgb urine dipstick: NEGATIVE
Ketones, ur: NEGATIVE mg/dL
LEUKOCYTES UA: NEGATIVE
Nitrite: NEGATIVE
PH: 7 (ref 5.0–8.0)
Protein, ur: NEGATIVE mg/dL
Specific Gravity, Urine: 1.026 (ref 1.005–1.030)

## 2017-07-12 MED ORDER — HYDROXYZINE HCL 25 MG PO TABS: 25.0000 mg | ORAL_TABLET | Freq: Four times a day (QID) | ORAL | 0 refills | Status: DC

## 2017-07-12 MED ORDER — TRIAMCINOLONE ACETONIDE 0.1 % EX CREA: 1.0000 "application " | TOPICAL_CREAM | Freq: Three times a day (TID) | CUTANEOUS | 0 refills | Status: DC

## 2017-07-12 NOTE — Discharge Instructions (Signed)
Someone will contact you from the hospital for any positive results.  Return to the ER for any worsening symptoms

## 2017-07-12 NOTE — ED Triage Notes (Signed)
Pt comes in with bilateral rash on forearms and on his thighs. This started last week. He has been using hydrocortisone with no relief.

## 2017-07-13 LAB — HSV 1 ANTIBODY, IGG: HSV 1 Glycoprotein G Ab, IgG: <0.91 index (ref 0.00–0.90)

## 2017-07-13 LAB — GC/CHLAMYDIA PROBE AMP (~~LOC~~) NOT AT ARMC
Chlamydia: NEGATIVE
Neisseria Gonorrhea: NEGATIVE

## 2017-07-13 LAB — HSV 2 ANTIBODY, IGG: HSV 2 Glycoprotein G Ab, IgG: 5.59 index — ABNORMAL HIGH (ref 0.00–0.90)

## 2017-07-13 LAB — RPR: RPR Ser Ql: NONREACTIVE

## 2017-07-13 LAB — HIV ANTIBODY (ROUTINE TESTING W REFLEX): HIV SCREEN 4TH GENERATION: NONREACTIVE
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
## 2018-10-07 ENCOUNTER — Emergency Department (HOSPITAL_COMMUNITY)
Admission: EM | Admit: 2018-10-07 | Discharge: 2018-10-07 | Disposition: A | Discharge status: Home / Self Care | Payer: Self-pay | Attending: Emergency Medicine | Admitting: Emergency Medicine

## 2018-10-07 ENCOUNTER — Encounter (HOSPITAL_COMMUNITY): Payer: Self-pay

## 2018-10-07 ENCOUNTER — Other Ambulatory Visit: Payer: Self-pay

## 2018-10-07 DIAGNOSIS — M791 Myalgia, unspecified site: Secondary | ICD-10-CM | POA: Insufficient documentation

## 2018-10-07 DIAGNOSIS — R630 Anorexia: Secondary | ICD-10-CM | POA: Insufficient documentation

## 2018-10-07 DIAGNOSIS — R05 Cough: Secondary | ICD-10-CM | POA: Insufficient documentation

## 2018-10-07 DIAGNOSIS — J3489 Other specified disorders of nose and nasal sinuses: Secondary | ICD-10-CM | POA: Insufficient documentation

## 2018-10-07 DIAGNOSIS — Z209 Contact with and (suspected) exposure to unspecified communicable disease: Secondary | ICD-10-CM | POA: Insufficient documentation

## 2018-10-07 DIAGNOSIS — R0981 Nasal congestion: Secondary | ICD-10-CM | POA: Insufficient documentation

## 2018-10-07 DIAGNOSIS — R6889 Other general symptoms and signs: Secondary | ICD-10-CM

## 2018-10-07 DIAGNOSIS — F17210 Nicotine dependence, cigarettes, uncomplicated: Secondary | ICD-10-CM | POA: Insufficient documentation

## 2018-10-07 MED ORDER — GUAIFENESIN 100 MG/5ML PO SYRP: 100.0000 mg | ORAL_SOLUTION | ORAL | 0 refills | Status: DC | PRN

## 2018-10-07 NOTE — Discharge Instructions (Addendum)
Your evaluated today for flulike symptoms.  You are outside treatment window for Tamiflu.  I would recommend Tylenol and ibuprofen as needed for fever and body aches and pains.  Please make sure to stay well-hydrated.  I have also written you a prescription for cough syrup.  Take as prescribed.  Return to the ED for any worsening symptoms.

## 2018-10-07 NOTE — ED Triage Notes (Signed)
Pt reports HA, cough, chills, and body aches since tuesday

## 2018-10-07 NOTE — ED Provider Notes (Signed)
[facility] EMERGENCY DEPARTMENT Provider Note   CSN: [id number] Arrival date & time: 10/07/18  1241   History   Chief Complaint Chief Complaint  Patient presents with  . flu ilke symptoms    HPI Andrew Moyer is a 23 y.o. male with no significant past medical history who presents for evaluation of flulike symptoms.  Symptom onset 4 days ago.  Patient states he has had nonproductive cough, nasal congestion, rhinorrhea, frontal sinus pressure, body aches and pains.  Has not taken anything for his symptoms PTA.  Did not receive influenza vaccine.  States he has felt warm, however has not taken his temperature.  Admits multiple sick contacts including his niece whom he lives with.  Describes his head pain as a fullness and points over his sinuses.  Rhinorrhea consistent with light yellow in color.  Has been able to tolerate p.o. intake without difficulty, however has had decreased appetite for solid foods.  Denies neck pain, neck stiffness, chest pain, shortness of breath, abdominal pain, nausea, vomiting, diarrhea, dysuria or rash.  Denies additional aggravating or alleviating factors.  History provided by patient.  No interpreter was used.    HPI  History reviewed. No pertinent past medical history.  Patient Active Problem List   Diagnosis Date Noted  . FRACTURE, WRIST, LEFT 01/31/2008    No past surgical history on file.      Home Medications    Prior to Admission medications   Medication Sig Start Date End Date Taking? Authorizing Provider  amoxicillin (AMOXIL) 500 MG capsule Take 1 capsule (500 mg total) by mouth 3 (three) times daily. Patient not taking: Reported on 08/27/2015 01/08/15   Arthor Captain, PA-C  azithromycin (ZITHROMAX) 250 MG tablet Take 1 tablet (250 mg total) by mouth daily. Take first 2 tablets together, then 1 every day until finished. Patient not taking: Reported on 08/27/2015 12/25/14   Janne Napoleon, NP  benzonatate (TESSALON) 100 MG capsule Take 1  capsule (100 mg total) by mouth every 8 (eight) hours. Patient not taking: Reported on 08/27/2015 12/25/14   Janne Napoleon, NP  diphenhydrAMINE (BENADRYL) 25 MG tablet Take 1 tablet (25 mg total) by mouth every 4 (four) hours as needed. For itching 03/22/17   Triplett, Tammy, PA-C  guaifenesin (ROBITUSSIN) 100 MG/5ML syrup Take 5-10 mLs (100-200 mg total) by mouth every 4 (four) hours as needed for cough. 10/07/18   Eugen Jeansonne A, PA-C  guaiFENesin-codeine 100-10 MG/5ML syrup Take 5-10 mLs by mouth every 6 (six) hours as needed for cough. Patient not taking: Reported on 08/27/2015 01/08/15   Arthor Captain, PA-C  HYDROcodone-acetaminophen (NORCO/VICODIN) 5-325 MG per tablet Take 1 tablet by mouth every 6 (six) hours as needed for pain. Patient not taking: Reported on 08/27/2015 05/30/12   Vickki Hearing, MD  hydrOXYzine (ATARAX/VISTARIL) 25 MG tablet Take 1 tablet (25 mg total) every 6 (six) hours by mouth. 07/12/17   Triplett, Tammy, PA-C  ibuprofen (ADVIL,MOTRIN) 800 MG tablet Take 1 tablet (800 mg total) by mouth 3 (three) times daily. 08/27/15   Triplett, Tammy, PA-C  magic mouthwash w/lidocaine SOLN Take 5 mLs by mouth 3 (three) times daily as needed for mouth pain. Swish and spit, do not swallow 08/27/15   Triplett, Tammy, PA-C  permethrin (ELIMITE) 5 % cream Apply from neck down.  Leave on for 8-10 hrs then wash off.  May repeat in 7 days if needed 03/22/17   Triplett, Tammy, PA-C  predniSONE (DELTASONE) 20 MG tablet Take 2  tablets (40 mg total) by mouth daily. Patient not taking: Reported on 08/27/2015 01/08/15   Arthor CaptainHarris, Abigail, PA-C  triamcinolone cream (KENALOG) 0.1 % Apply 1 application 3 (three) times daily topically. 07/12/17   Pauline Ausriplett, Tammy, PA-C    Family History Family History  Problem Relation Age of Onset  . Diabetes Other     Social History Social History   Tobacco Use  . Smoking status: Current Every Day Smoker    Packs/day: 0.50    Types: Cigarettes  .  Smokeless tobacco: Never Used  Substance Use Topics  . Alcohol use: Not Currently    Comment: occasional  . Drug use: No     Allergies   Patient has no known allergies.   Review of Systems Review of Systems  Constitutional: Positive for fever.  HENT: Positive for congestion, postnasal drip, rhinorrhea and sinus pressure. Negative for ear discharge, ear pain, sinus pain, sneezing, sore throat, trouble swallowing and voice change.   Respiratory: Positive for cough. Negative for choking, chest tightness, shortness of breath, wheezing and stridor.   Cardiovascular: Negative.   Gastrointestinal: Negative.   Genitourinary: Negative.   Musculoskeletal: Negative.   Skin: Negative.   Neurological: Negative.   All other systems reviewed and are negative.    Physical Exam Updated Vital Signs BP 132/76 (BP Location: Right Arm)   Pulse 94   Temp 99.6 F (37.6 C) (Oral)   Resp 18   Ht 6' (1.829 m)   Wt 113.4 kg   SpO2 100%   BMI 33.91 kg/m   Physical Exam Vitals signs and nursing note reviewed.  Constitutional:      General: He is not in acute distress.    Appearance: He is well-developed. He is not ill-appearing, toxic-appearing or diaphoretic.  HENT:     Head: Normocephalic and atraumatic.     Jaw: There is normal jaw occlusion.     Right Ear: Tympanic membrane, ear canal and external ear normal. No drainage, swelling or tenderness. Tympanic membrane is not injected, scarred, perforated, erythematous, retracted or bulging.     Left Ear: Tympanic membrane, ear canal and external ear normal. No drainage, swelling or tenderness. Tympanic membrane is not injected, scarred, perforated, erythematous, retracted or bulging.     Nose: Congestion and rhinorrhea present.     Right Sinus: Frontal sinus tenderness present. No maxillary sinus tenderness.     Left Sinus: Frontal sinus tenderness present. No maxillary sinus tenderness.     Comments: Clear rhinorrhea to bilateral nares.     Mouth/Throat:     Comments: Posterior oropharynx with mild erythema without exudate.  Uvula midline without deviation.  No evidence of tonsillar edema or exudate.  Mucous membranes moist. Eyes:     Pupils: Pupils are equal, round, and reactive to light.  Neck:     Musculoskeletal: Normal range of motion and neck supple.     Comments: No neck stiffness or neck rigidity.  No meningismus.  Phonation normal.  No drooling or dysphasia. Cardiovascular:     Rate and Rhythm: Normal rate and regular rhythm.     Pulses: Normal pulses.     Heart sounds: Normal heart sounds.  Pulmonary:     Effort: Pulmonary effort is normal. No respiratory distress.     Comments: Clear to auscultation bilaterally without wheeze, rhonchi or rales.  No accessory muscle usage.  Able speak in full sentences without difficulty.  No tachypnea. Abdominal:     General: There is no distension.  Palpations: Abdomen is soft.     Comments: Soft, Nontender without rebound or guarding.  Musculoskeletal: Normal range of motion.     Comments: Moves all extremities without difficulty.  Skin:    General: Skin is warm and dry.     Comments: No rashes  Neurological:     Mental Status: He is alert.     Comments: Mental Status:  Alert, oriented, thought content appropriate. Speech fluent without evidence of aphasia. Able to follow 2 step commands without difficulty.  Cranial Nerves:  II:  Peripheral visual fields grossly normal, pupils equal, round, reactive to light III,IV, VI: ptosis not present, extra-ocular motions intact bilaterally  V,VII: smile symmetric, facial light touch sensation equal VIII: hearing grossly normal bilaterally  IX,X: midline uvula rise  XI: bilateral shoulder shrug equal and strong XII: midline tongue extension  Motor:  5/5 in upper and lower extremities bilaterally including strong and equal grip strength and dorsiflexion/plantar flexion Sensory: Pinprick and light touch normal in all extremities.    Deep Tendon Reflexes: 2+ and symmetric  Cerebellar: normal finger-to-nose with bilateral upper extremities Gait: normal gait and balance CV: distal pulses palpable throughout       ED Treatments / Results  Labs (all labs ordered are listed, but only abnormal results are displayed) Labs Reviewed - No data to display  EKG None  Radiology No results found.  Procedures Procedures (including critical care time)  Medications Ordered in ED Medications - No data to display   Initial Impression / Assessment and Plan / ED Course  I have reviewed the triage vital signs and the nursing notes.  Pertinent labs & imaging results that were available during my care of the patient were reviewed by me and considered in my medical decision making (see chart for details).  23 year old who appears otherwise well presents for evaluation of flulike symptoms.  Afebrile, nonseptic, non-ill-appearing. Lungs clear to auscultation bilaterally without wheeze, rhonchi or rales.  No tachypnea, tachycardia or hypoxia, low suspicion for pneumonia.  Posterior oropharynx clear, uvula midline, no tonsillar edema or exudate, low suspicion for strep.  Patient's headache likely related to congestion.  Nonfocal neurologic exam without neurologic deficits.  No neck stiffness or neck rigidity.  Patient with symptoms consistent with influenza.  Vitals are stable.  No signs of dehydration, tolerating PO's. Due to patient's presentation and physical exam a chest x-ray was not ordered bc likely diagnosis of flu. The patient understands that symptoms are greater than the recommended 24-48 hour window of treatment.  Patient will be discharged with instructions to orally hydrate, rest, and use over-the-counter medications such as anti-inflammatories ibuprofen and Aleve for muscle aches and Tylenol for fever.  Patient will also be given a cough suppressant. Patient is hemodynamically stable and appropriate for DC home at this time.  I  have discussed return precautions.  Patient voiced understanding and is agreeable for follow-up.   Final Clinical Impressions(s) / ED Diagnoses   Final diagnoses:  Flu-like symptoms    ED Discharge Orders         Ordered    guaifenesin (ROBITUSSIN) 100 MG/5ML syrup  Every 4 hours PRN     10/07/18 1635           Yavier Snider A, PA-C 10/07/18 1638    Loren RacerYelverton, David, MD 10/08/18 1540

## 2019-03-03 ENCOUNTER — Other Ambulatory Visit: Payer: Self-pay

## 2019-03-03 ENCOUNTER — Encounter (HOSPITAL_COMMUNITY): Payer: Self-pay

## 2019-03-03 ENCOUNTER — Emergency Department (HOSPITAL_COMMUNITY)
Admission: EM | Admit: 2019-03-03 | Discharge: 2019-03-03 | Payer: Self-pay | Attending: Emergency Medicine | Admitting: Emergency Medicine

## 2019-03-03 DIAGNOSIS — Y9389 Activity, other specified: Secondary | ICD-10-CM | POA: Insufficient documentation

## 2019-03-03 DIAGNOSIS — S61011A Laceration without foreign body of right thumb without damage to nail, initial encounter: Secondary | ICD-10-CM | POA: Insufficient documentation

## 2019-03-03 DIAGNOSIS — Y999 Unspecified external cause status: Secondary | ICD-10-CM | POA: Insufficient documentation

## 2019-03-03 DIAGNOSIS — Y92010 Kitchen of single-family (private) house as the place of occurrence of the external cause: Secondary | ICD-10-CM | POA: Insufficient documentation

## 2019-03-03 DIAGNOSIS — F17210 Nicotine dependence, cigarettes, uncomplicated: Secondary | ICD-10-CM | POA: Insufficient documentation

## 2019-03-03 DIAGNOSIS — W269XXA Contact with unspecified sharp object(s), initial encounter: Secondary | ICD-10-CM | POA: Insufficient documentation

## 2019-03-03 MED ORDER — LIDOCAINE HCL (PF) 2 % IJ SOLN
INTRAMUSCULAR | Status: AC
  Administered 2019-03-03: 05:00:00
  Filled 2019-03-03: qty 20

## 2019-03-03 MED ORDER — POVIDONE-IODINE 10 % EX SOLN
CUTANEOUS | Status: AC
  Administered 2019-03-03: 05:00:00
  Filled 2019-03-03: qty 15

## 2019-03-03 NOTE — ED Notes (Signed)
Pt depart with Officer Dixon from [employer] to go to jail
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
## 2019-03-03 NOTE — Discharge Instructions (Addendum)
Keep the wound clean and dry.  You can clean it once a day with soap and water.  Pat it dry, do not rub it.  You can use antibiotic ointment on the wound to help prevent infection.  The sutures will need to be removed in about 12 days.  You can return to the ED to have that done.  Recheck sooner if you feel like the laceration is getting infected, increased redness, swelling, pain, or drainage of pus.

## 2019-03-03 NOTE — ED Provider Notes (Signed)
[facility] EMERGENCY DEPARTMENT Provider Note   CSN: [id number] Arrival date & time: 03/03/19  0351  Time seen 4:12 AM  History   Chief Complaint Chief Complaint  Patient presents with  . Laceration    HPI Andrew Moyer is a 23 y.o. male.     HPI patient presents in police custody.  He states he has a cut on his right thumb.  He cannot tell me how it happened.  Police estimate it happened around 2:30 AM.  He denies any pain.  He states he thinks maybe he hit it on a kitchen cabinet or a fork because the laceration.  Patient is right-handed.  He states his last tetanus was in 2018.  PCP Patient, No Pcp Per   History reviewed. No pertinent past medical history.  Patient Active Problem List   Diagnosis Date Noted  . FRACTURE, WRIST, LEFT 01/31/2008    History reviewed. No pertinent surgical history.      Home Medications    Prior to Admission medications   Medication Sig Start Date End Date Taking? Authorizing Provider  amoxicillin (AMOXIL) 500 MG capsule Take 1 capsule (500 mg total) by mouth 3 (three) times daily. Patient not taking: Reported on 08/27/2015 01/08/15   Arthor CaptainHarris, Abigail, PA-C  azithromycin (ZITHROMAX) 250 MG tablet Take 1 tablet (250 mg total) by mouth daily. Take first 2 tablets together, then 1 every day until finished. Patient not taking: Reported on 08/27/2015 12/25/14   Janne NapoleonNeese, Hope M, NP  benzonatate (TESSALON) 100 MG capsule Take 1 capsule (100 mg total) by mouth every 8 (eight) hours. Patient not taking: Reported on 08/27/2015 12/25/14   Janne NapoleonNeese, Hope M, NP  diphenhydrAMINE (BENADRYL) 25 MG tablet Take 1 tablet (25 mg total) by mouth every 4 (four) hours as needed. For itching 03/22/17   Triplett, Tammy, PA-C  guaifenesin (ROBITUSSIN) 100 MG/5ML syrup Take 5-10 mLs (100-200 mg total) by mouth every 4 (four) hours as needed for cough. 10/07/18   Henderly, Britni A, PA-C  guaiFENesin-codeine 100-10 MG/5ML syrup Take 5-10 mLs by mouth every 6 (six) hours as  needed for cough. Patient not taking: Reported on 08/27/2015 01/08/15   Arthor CaptainHarris, Abigail, PA-C  HYDROcodone-acetaminophen (NORCO/VICODIN) 5-325 MG per tablet Take 1 tablet by mouth every 6 (six) hours as needed for pain. Patient not taking: Reported on 08/27/2015 05/30/12   Vickki HearingHarrison, Stanley E, MD  hydrOXYzine (ATARAX/VISTARIL) 25 MG tablet Take 1 tablet (25 mg total) every 6 (six) hours by mouth. 07/12/17   Triplett, Tammy, PA-C  ibuprofen (ADVIL,MOTRIN) 800 MG tablet Take 1 tablet (800 mg total) by mouth 3 (three) times daily. 08/27/15   Triplett, Tammy, PA-C  magic mouthwash w/lidocaine SOLN Take 5 mLs by mouth 3 (three) times daily as needed for mouth pain. Swish and spit, do not swallow 08/27/15   Triplett, Tammy, PA-C  permethrin (ELIMITE) 5 % cream Apply from neck down.  Leave on for 8-10 hrs then wash off.  May repeat in 7 days if needed 03/22/17   Triplett, Tammy, PA-C  predniSONE (DELTASONE) 20 MG tablet Take 2 tablets (40 mg total) by mouth daily. Patient not taking: Reported on 08/27/2015 01/08/15   Arthor CaptainHarris, Abigail, PA-C  triamcinolone cream (KENALOG) 0.1 % Apply 1 application 3 (three) times daily topically. 07/12/17   Pauline Ausriplett, Tammy, PA-C    Family History Family History  Problem Relation Age of Onset  . Diabetes Other     Social History Social History   Tobacco Use  . Smoking status:  Current Every Day Smoker    Packs/day: 0.50    Types: Cigarettes  . Smokeless tobacco: Never Used  Substance Use Topics  . Alcohol use: Not Currently    Comment: occasional  . Drug use: No  was in the [employer]   Allergies   Patient has no known allergies.   Review of Systems Review of Systems  All other systems reviewed and are negative.    Physical Exam Updated Vital Signs BP 121/60 (BP Location: Left Arm)   Pulse (!) 104   Temp 98.6 F (37 C) (Oral)   Resp 18   Ht 6' (1.829 m)   Wt 108.9 kg   SpO2 97%   BMI 32.55 kg/m   Physical Exam Vitals signs and nursing note  reviewed.  Constitutional:      Appearance: Normal appearance. He is obese.  HENT:     Head: Normocephalic and atraumatic.     Right Ear: External ear normal.     Left Ear: External ear normal.     Nose: Nose normal.  Eyes:     Extraocular Movements: Extraocular movements intact.     Conjunctiva/sclera: Conjunctivae normal.  Neck:     Musculoskeletal: Normal range of motion.  Cardiovascular:     Rate and Rhythm: Normal rate.  Pulmonary:     Effort: Pulmonary effort is normal. No respiratory distress.  Musculoskeletal:        General: Signs of injury present. No tenderness or deformity.  Skin:    General: Skin is warm and dry.     Capillary Refill: Capillary refill takes less than 2 seconds.     Comments: Patient has a 2 cm linear laceration just proximal to the IP joint of his right thumb that is more on the radial aspect.  He has good range of motion at the IP joint.  Neurological:     General: No focal deficit present.     Mental Status: He is alert and oriented to person, place, and time. Mental status is at baseline.  Psychiatric:        Mood and Affect: Mood normal.        Behavior: Behavior normal.        Thought Content: Thought content normal.        ED Treatments / Results  Labs (all labs ordered are listed, but only abnormal results are displayed) Labs Reviewed - No data to display  EKG None  Radiology No results found.  Procedures .Marland KitchenLaceration Repair  Date/Time: 03/03/2019 4:40 AM Performed by: Rolland Porter, MD Authorized by: Rolland Porter, MD   Consent:    Consent obtained:  Verbal   Consent given by:  Patient Anesthesia (see MAR for exact dosages):    Anesthesia method:  Local infiltration   Local anesthetic:  Lidocaine 2% w/o epi Laceration details:    Location:  Finger   Finger location:  R thumb   Length (cm):  2   Laceration depth: through the dermis. Repair type:    Repair type:  Simple Pre-procedure details:    Preparation:  Patient was  prepped and draped in usual sterile fashion Exploration:    Hemostasis achieved with:  Direct pressure   Wound exploration: wound explored through full range of motion and entire depth of wound probed and visualized     Wound extent: no foreign bodies/material noted, no nerve damage noted and no tendon damage noted     Contaminated: no   Treatment:    Area cleansed with:  Betadine   Amount of cleaning:  Standard Skin repair:    Repair method:  Sutures   Suture size:  4-0   Suture material:  Nylon   Suture technique:  Simple interrupted   Number of sutures:  5 Approximation:    Approximation:  Close Post-procedure details:    Dressing:  Antibiotic ointment and non-adherent dressing   Patient tolerance of procedure:  Tolerated well, no immediate complications   (including critical care time)  Medications Ordered in ED Medications  lidocaine (XYLOCAINE) 2 % injection (has no administration in time range)  povidone-iodine (BETADINE) 10 % external solution (has no administration in time range)     Initial Impression / Assessment and Plan / ED Course  I have reviewed the triage vital signs and the nursing notes.  Pertinent labs & imaging results that were available during my care of the patient were reviewed by me and considered in my medical decision making (see chart for details).       Patient's laceration was sutured and he was released from the ED in police custody.  He was given his discharge instructions.  Final Clinical Impressions(s) / ED Diagnoses   Final diagnoses:  Thumb laceration, right, initial encounter    ED Discharge Orders    None     Plan discharge  Devoria AlbeIva Lindley Hiney, MD, Concha PyoFACEP    Honor Fairbank, MD 03/03/19 [phone]

## 2019-03-19 ENCOUNTER — Other Ambulatory Visit: Payer: Self-pay

## 2019-03-19 ENCOUNTER — Encounter (HOSPITAL_COMMUNITY): Payer: Self-pay

## 2019-03-19 ENCOUNTER — Emergency Department (HOSPITAL_COMMUNITY)
Admission: EM | Admit: 2019-03-19 | Discharge: 2019-03-19 | Disposition: A | Discharge status: Home / Self Care | Payer: Self-pay | Attending: Emergency Medicine | Admitting: Emergency Medicine

## 2019-03-19 DIAGNOSIS — Z4802 Encounter for removal of sutures: Secondary | ICD-10-CM

## 2019-03-19 DIAGNOSIS — F17210 Nicotine dependence, cigarettes, uncomplicated: Secondary | ICD-10-CM | POA: Insufficient documentation

## 2019-03-19 DIAGNOSIS — S61011D Laceration without foreign body of right thumb without damage to nail, subsequent encounter: Secondary | ICD-10-CM | POA: Insufficient documentation

## 2019-03-19 DIAGNOSIS — W268XXD Contact with other sharp object(s), not elsewhere classified, subsequent encounter: Secondary | ICD-10-CM | POA: Insufficient documentation

## 2019-03-19 NOTE — ED Provider Notes (Signed)
[facility] EMERGENCY DEPARTMENT Provider Note   CSN: [id number] Arrival date & time: 03/19/19  1139    History   Chief Complaint Chief Complaint  Patient presents with  . Suture / Staple Removal    HPI Andrew Moyer is a 23 y.o. male.     23 year old male presents for suture removal.  Patient had 5 sutures placed in his right thumb 16 days ago. Wound is healing well, no concerns.      History reviewed. No pertinent past medical history.  Patient Active Problem List   Diagnosis Date Noted  . FRACTURE, WRIST, LEFT 01/31/2008    History reviewed. No pertinent surgical history.      Home Medications    Prior to Admission medications   Medication Sig Start Date End Date Taking? Authorizing Provider  diphenhydrAMINE (BENADRYL) 25 MG tablet Take 1 tablet (25 mg total) by mouth every 4 (four) hours as needed. For itching 03/22/17 03/19/19  Kem Parkinson, PA-C    Family History Family History  Problem Relation Age of Onset  . Diabetes Other     Social History Social History   Tobacco Use  . Smoking status: Current Every Day Smoker    Packs/day: 0.50    Types: Cigarettes  . Smokeless tobacco: Never Used  Substance Use Topics  . Alcohol use: Not Currently    Comment: occasional  . Drug use: No     Allergies   Patient has no known allergies.   Review of Systems Review of Systems  Constitutional: Negative for fever.  Musculoskeletal: Negative for arthralgias, joint swelling and myalgias.  Skin: Positive for wound.  Allergic/Immunologic: Negative for immunocompromised state.  Neurological: Negative for weakness and numbness.  All other systems reviewed and are negative.    Physical Exam Updated Vital Signs BP 128/88 (BP Location: Right Arm)   Pulse 82   Temp 98.4 F (36.9 C) (Oral)   Resp 12   Ht 6' (1.829 m)   Wt 106.6 kg   SpO2 99%   BMI 31.87 kg/m   Physical Exam Vitals signs and nursing note reviewed.  Constitutional:    General: He is not in acute distress.    Appearance: He is well-developed. He is not diaphoretic.  HENT:     Head: Normocephalic and atraumatic.  Pulmonary:     Effort: Pulmonary effort is normal.  Musculoskeletal:        General: No tenderness or deformity.       Hands:  Skin:    General: Skin is warm and dry.     Findings: No erythema or rash.  Neurological:     Mental Status: He is alert and oriented to person, place, and time.     Sensory: No sensory deficit.  Psychiatric:        Behavior: Behavior normal.      ED Treatments / Results  Labs (all labs ordered are listed, but only abnormal results are displayed) Labs Reviewed - No data to display  EKG None  Radiology No results found.  Procedures .Suture Removal  Date/Time: 03/19/2019 12:58 PM Performed by: Tacy Learn, PA-C Authorized by: Tacy Learn, PA-C   Consent:    Consent obtained:  Verbal   Consent given by:  Patient   Risks discussed:  Bleeding, wound separation and pain   Alternatives discussed:  No treatment Location:    Location:  Upper extremity   Upper extremity location:  Hand   Hand location:  R thumb Procedure details:  Wound appearance:  No signs of infection   Number of sutures removed:  5 Post-procedure details:    Post-removal:  No dressing applied   Patient tolerance of procedure:  Tolerated well, no immediate complications   (including critical care time)  Medications Ordered in ED Medications - No data to display   Initial Impression / Assessment and Plan / ED Course  I have reviewed the triage vital signs and the nursing notes.  Pertinent labs & imaging results that were available during my care of the patient were reviewed by me and considered in my medical decision making (see chart for details).  Clinical Course as of Mar 18 1257  Mon Mar 19, 2019  1258 5 sutures removed without difficulty, question keloid developing in the area.  Advised patient to massage  scar with vitamin E oil.   [LM]    Clinical Course User Index [LM] Jeannie FendMurphy,  A, PA-C      Final Clinical Impressions(s) / ED Diagnoses   Final diagnoses:  Visit for suture removal    ED Discharge Orders    None       Alden HippMurphy,  A, PA-C 03/19/19 1258    Benjiman CorePickering, Nathan, MD 03/20/19 [id number]

## 2019-03-19 NOTE — ED Triage Notes (Signed)
Pt presents to ED for suture removal of right thumb. This is day 14. Pt denies any problems

## 2022-12-20 ENCOUNTER — Emergency Department (HOSPITAL_COMMUNITY): Payer: Self-pay

## 2022-12-20 ENCOUNTER — Encounter (HOSPITAL_COMMUNITY): Payer: Self-pay | Admitting: Emergency Medicine

## 2022-12-20 ENCOUNTER — Other Ambulatory Visit: Payer: Self-pay

## 2022-12-20 DIAGNOSIS — R509 Fever, unspecified: Secondary | ICD-10-CM | POA: Insufficient documentation

## 2022-12-20 DIAGNOSIS — R059 Cough, unspecified: Secondary | ICD-10-CM | POA: Insufficient documentation

## 2022-12-20 DIAGNOSIS — Z5321 Procedure and treatment not carried out due to patient leaving prior to being seen by health care provider: Secondary | ICD-10-CM | POA: Insufficient documentation

## 2022-12-20 NOTE — ED Triage Notes (Signed)
Pt arrives c/o cough, chills, and fever for the past 3 weeks. Pt states he has been taking OTC medications with no improvement.

## 2022-12-21 ENCOUNTER — Emergency Department (HOSPITAL_COMMUNITY)
Admission: EM | Admit: 2022-12-21 | Discharge: 2022-12-21 | Discharge status: Against Medical Advice | Payer: Self-pay | Attending: Emergency Medicine | Admitting: Emergency Medicine

## 2023-05-29 ENCOUNTER — Encounter (HOSPITAL_COMMUNITY): Payer: Self-pay | Admitting: Emergency Medicine

## 2023-05-29 ENCOUNTER — Other Ambulatory Visit: Payer: Self-pay

## 2023-05-29 ENCOUNTER — Emergency Department (HOSPITAL_COMMUNITY)
Admission: EM | Admit: 2023-05-29 | Discharge: 2023-05-29 | Disposition: A | Discharge status: Home / Self Care | Payer: Self-pay | Attending: Emergency Medicine | Admitting: Emergency Medicine

## 2023-05-29 DIAGNOSIS — F17210 Nicotine dependence, cigarettes, uncomplicated: Secondary | ICD-10-CM | POA: Insufficient documentation

## 2023-05-29 DIAGNOSIS — L03312 Cellulitis of back [any part except buttock]: Secondary | ICD-10-CM | POA: Insufficient documentation

## 2023-05-29 MED ORDER — BACITRACIN ZINC 500 UNIT/GM EX OINT: 1.0000 | TOPICAL_OINTMENT | Freq: Two times a day (BID) | CUTANEOUS | 0 refills | Status: AC

## 2023-05-29 MED ORDER — CEFADROXIL 500 MG PO CAPS: 500.0000 mg | ORAL_CAPSULE | Freq: Two times a day (BID) | ORAL | 0 refills | Status: AC

## 2023-05-29 MED ORDER — POVIDONE-IODINE 10 % EX SOLN
CUTANEOUS | Status: AC
  Filled 2023-05-29: qty 14.8

## 2023-05-29 MED ORDER — LIDOCAINE-EPINEPHRINE (PF) 2 %-1:200000 IJ SOLN
10.0000 mL | Freq: Once | INTRAMUSCULAR | Status: DC
  Filled 2023-05-29: qty 20

## 2023-05-29 NOTE — ED Provider Notes (Signed)
[place] EMERGENCY DEPARTMENT AT [facility] Provider Note   CSN: [id number] Arrival date & time: 05/29/23  1720     History  Chief Complaint  Patient presents with   Abscess    Andrew Moyer is a 27 y.o. male.   Abscess   27 year old male presents emergency department complaints of abscess.  Patient reports 4 days ago feeling pain as well as raised area on his right upper back.  Reports his significant other lifted area states that it looked infected.  Unaware of whether or not he was bitten by a bug but is concerned about abscess currently.  Denies any fever, chills, night sweats.  Has been putting baking soda over area to try to alleviate symptoms without success.  No significant pertinent past medical history.  Home Medications Prior to Admission medications   Medication Sig Start Date End Date Taking? Authorizing Provider  bacitracin ointment Apply 1 Application topically 2 (two) times daily. 05/29/23  Yes Sherian Maroon A, PA  cefadroxil (DURICEF) 500 MG capsule Take 1 capsule (500 mg total) by mouth 2 (two) times daily. 05/29/23  Yes Sherian Maroon A, PA  diphenhydrAMINE (BENADRYL) 25 MG tablet Take 1 tablet (25 mg total) by mouth every 4 (four) hours as needed. For itching 03/22/17 03/19/19  Pauline Aus, PA-C      Allergies    Patient has no known allergies.    Review of Systems   Review of Systems  All other systems reviewed and are negative.   Physical Exam Updated Vital Signs BP 109/76 (BP Location: Right Arm)   Pulse 81   Temp 98.1 F (36.7 C) (Oral)   Resp 16   SpO2 97%  Physical Exam Vitals and nursing note reviewed.  Constitutional:      General: He is not in acute distress.    Appearance: He is well-developed.  HENT:     Head: Normocephalic and atraumatic.  Eyes:     Conjunctiva/sclera: Conjunctivae normal.  Cardiovascular:     Rate and Rhythm: Normal rate and regular rhythm.     Heart sounds: No murmur heard. Pulmonary:      Effort: Pulmonary effort is normal. No respiratory distress.     Breath sounds: Normal breath sounds.  Abdominal:     Palpations: Abdomen is soft.     Tenderness: There is no abdominal tenderness.  Musculoskeletal:        General: No swelling.     Cervical back: Neck supple.  Skin:    General: Skin is warm and dry.     Capillary Refill: Capillary refill takes less than 2 seconds.     Comments: Right upper thoracic back with 2.0 cm diameter area of induration.  No obvious palpable fluctuance.  Extending erythema measuring 4 to 5 cm.  Bedside ultrasound showed no evidence of fluid collection; cobblestoning appreciated..  Warm to palpation over area of concern.  Neurological:     Mental Status: He is alert.  Psychiatric:        Mood and Affect: Mood normal.     ED Results / Procedures / Treatments   Labs (all labs ordered are listed, but only abnormal results are displayed) Labs Reviewed - No data to display  EKG None  Radiology No results found.  Procedures Procedures    Medications Ordered in ED Medications  povidone-iodine (BETADINE) 10 % external solution (has no administration in time range)    ED Course/ Medical Decision Making/ A&P  Medical Decision Making Risk OTC drugs. Prescription drug management.   This patient presents to the ED for concern of abscess, this involves an extensive number of treatment options, and is a complaint that carries with it a high risk of complications and morbidity.  The differential diagnosis includes cellulitis, abscess, necrotizing infection, other   Co morbidities that complicate the patient evaluation  See HPI   Additional history obtained:  Additional history obtained from EMR External records from outside source obtained and reviewed including hospital records   Lab Tests:  N/a   Imaging Studies ordered:  N/a   Cardiac Monitoring: / EKG:  The patient was maintained on a  cardiac monitor.  I personally viewed and interpreted the cardiac monitored which showed an underlying rhythm of: Sinus rhythm   Consultations Obtained:  N/a   Problem List / ED Course / Critical interventions / Medication management  Cellulitis I ordered medication including lidocaine with epinephrine   Reevaluation of the patient after these medicines showed that the patient improved I have reviewed the patients home medicines and have made adjustments as needed   Social Determinants of Health:  Current cigarette use.  Denies illicit drug use.   Test / Admission - Considered:  Cellulitis Vitals signs within normal range and stable throughout visit. 26 year old male presents emergency department with complaints of red area right upper back.  On exam, patient with erythematous and indurated area on right upper back.  No obvious palpable fluctuance initially but given concern for abscess, bedside ultrasound was performed which did not show any area of fluid collection able to be drained.  Will place patient on empiric antibiotics for concern for cellulitis as well as recommend stop using baking soda over area.  Recommend follow-up with primary care for reassessment of symptoms.  Treatment plan discussed at length with patient and he acknowledged understanding was agreeable to said plan.  Patient overall well-appearing, afebrile in no acute distress. Worrisome signs and symptoms were discussed with the patient, and the patient acknowledged understanding to return to the ED if noticed. Patient was stable upon discharge.          Final Clinical Impression(s) / ED Diagnoses Final diagnoses:  Cellulitis of back except buttock    Rx / DC Orders ED Discharge Orders          Ordered    cefadroxil (DURICEF) 500 MG capsule  2 times daily        05/29/23 1839    bacitracin ointment  2 times daily        05/29/23 1840              Peter Garter, [place] 05/29/23 Salley Scarlet, MD 05/31/23 0009

## 2023-05-29 NOTE — Discharge Instructions (Addendum)
As discussed, concern for skin infection.  Will place you on antibiotics in the form of Duricef.  Will also send in topical antibiotic placed on area twice daily.  If you develop fever, worsening redness/pain/swelling, please return to emergency department.  No obvious area of abscess to drain now but that does not mean that you could develop abscess.  Please do not hesitate to return to emergency department for worrisome signs and symptoms we discussed become apparent.

## 2023-05-29 NOTE — ED Triage Notes (Signed)
Pt has small area of redness/swelling noted to right posterior upper back x 4 days ago. Nad.
